# Patient Record
Sex: Male | Born: 1995 | Race: White | Hispanic: No | Marital: Single | Smoking: Never smoker
Health system: Southern US, Community
[De-identification: ages and names within clinical notes are randomized; demographics above are authoritative.]

## PROBLEM LIST (undated history)

## (undated) DIAGNOSIS — J189 Pneumonia, unspecified organism: Secondary | ICD-10-CM

## (undated) DIAGNOSIS — K3184 Gastroparesis: Secondary | ICD-10-CM

## (undated) DIAGNOSIS — J45909 Unspecified asthma, uncomplicated: Secondary | ICD-10-CM

## (undated) HISTORY — DX: Pneumonia, unspecified organism: J18.9

## (undated) HISTORY — DX: Gastroparesis: K31.84

---
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
## 2016-05-03 ENCOUNTER — Ambulatory Visit (INDEPENDENT_AMBULATORY_CARE_PROVIDER_SITE_OTHER)
Admit: 2016-05-03 | Discharge: 2016-05-03 | Disposition: A | Discharge status: Home / Self Care | Payer: BLUE CROSS/BLUE SHIELD | Attending: Family Medicine | Admitting: Family Medicine

## 2016-05-03 ENCOUNTER — Ambulatory Visit
Admission: EM | Admit: 2016-05-03 | Discharge: 2016-05-03 | Disposition: A | Discharge status: Home / Self Care | Payer: BLUE CROSS/BLUE SHIELD | Attending: Family Medicine | Admitting: Family Medicine

## 2016-05-03 DIAGNOSIS — G4489 Other headache syndrome: Secondary | ICD-10-CM | POA: Diagnosis not present

## 2016-05-03 DIAGNOSIS — S060X0A Concussion without loss of consciousness, initial encounter: Secondary | ICD-10-CM | POA: Diagnosis not present

## 2016-05-03 DIAGNOSIS — S0990XA Unspecified injury of head, initial encounter: Secondary | ICD-10-CM

## 2016-05-03 NOTE — ED Triage Notes (Signed)
Pt c/o headache that started yesterday after he hit his head on the under side of a car he was working on. He was sliding underneath it went he hit his head on the bottom of the car. Top of crown is sore, no visible bruising or lacerations.

## 2016-05-03 NOTE — ED Provider Notes (Signed)
[facility]    CSN: [id number] Arrival date & time: 05/03/16  1703     History   Chief Complaint Chief Complaint  Patient presents with  . Headache    HPI Corey Schroeder is a 21 y.o. male.   Patient is a 21 year old white male hit his head on a car engine lift yesterday. He was doing work on [employer] and apparently wasn't aware of closest and was engine lift and hit his head on the engine. He denies any loss of consciousness yesterday but states became lightheaded dizzy. The headache has gotten worse she's had increased lightheadedness and dizziness and has some nausea today as well. Because of the increased headache and dizziness and this lasting more than 24 hours he came in to be seen and evaluated. No previous surgeries or operations he has a history of asthma gastroparesis and history pneumonia before. He does not smoke and is a history of heart disease both sides of his family grandparents. No known drug allergies   The history is provided by the patient. No language interpreter was used.  Headache  Radiates to:  Upper back and lower back Timing:  Rare Progression:  Worsening Similar to prior headaches: yes   Associated symptoms: fatigue and nausea     Past Medical History:  Diagnosis Date  . Asthma   . Gastroparesis   . Pneumonia     Patient Active Problem List   Diagnosis Date Noted  . Nausea with vomiting 03/30/2015    History reviewed. No pertinent surgical history.     Home Medications    Prior to Admission medications   Medication Sig Start Date End Date Taking? Authorizing Provider  AMBULATORY NON FORMULARY MEDICATION Digestie Max 1 tablet by mouth per meal    Historical Provider, MD    Family History Family History  Problem Relation Age of Onset  . Heart disease Paternal Grandfather   . Heart disease Maternal Grandfather     Social History Social History  Substance Use Topics  . Smoking status: Never Smoker  . Smokeless  tobacco: Never Used  . Alcohol use No     Allergies   Patient has no known allergies.   Review of Systems Review of Systems  Constitutional: Positive for fatigue.  Gastrointestinal: Positive for nausea.  Neurological: Positive for headaches.     Physical Exam Triage Vital Signs ED Triage Vitals  Enc Vitals Group     BP 05/03/16 1711 124/67     Pulse Rate 05/03/16 1711 75     Resp 05/03/16 1711 18     Temp 05/03/16 1711 97.6 F (36.4 C)     Temp Source 05/03/16 1711 Oral     SpO2 05/03/16 1711 100 %     Weight 05/03/16 1711 130 lb (59 kg)     Height 05/03/16 1711 5\' 11"  (1.803 m)     Head Circumference --      Peak Flow --      Pain Score 05/03/16 1712 8     Pain Loc --      Pain Edu? --      Excl. in GC? --    No data found.   Updated Vital Signs BP 124/67 (BP Location: Left Arm)   Pulse 75   Temp 97.6 F (36.4 C) (Oral)   Resp 18   Ht 5\' 11"  (1.803 m)   Wt 130 lb (59 kg)   SpO2 100%   BMI 18.13 kg/m  Visual Acuity Right Eye Distance:   Left Eye Distance:   Bilateral Distance:    Right Eye Near:   Left Eye Near:    Bilateral Near:     Physical Exam  Constitutional: He is oriented to person, place, and time. He appears well-developed and well-nourished.  HENT:  Head: Normocephalic and atraumatic.  Right Ear: External ear normal.  Left Ear: External ear normal.  Nose: Nose normal. Right sinus exhibits no maxillary sinus tenderness and no frontal sinus tenderness. Left sinus exhibits no maxillary sinus tenderness and no frontal sinus tenderness.  Mouth/Throat: Uvula is midline, oropharynx is clear and moist and mucous membranes are normal.  Eyes: EOM are normal. Pupils are equal, round, and reactive to light.  Neck: Normal range of motion. Neck supple. No tracheal deviation present.  Cardiovascular: Regular rhythm and normal heart sounds.   Pulmonary/Chest: Effort normal.  Musculoskeletal: Normal range of motion.  Lymphadenopathy:    He has no  cervical adenopathy.  Neurological: He is alert and oriented to person, place, and time.  Skin: Skin is warm.  Psychiatric: He has a normal mood and affect. His behavior is normal.  Vitals reviewed.    UC Treatments / Results  Labs (all labs ordered are listed, but only abnormal results are displayed) Labs Reviewed - No data to display  EKG  EKG Interpretation None       Radiology Ct Head Wo Contrast  Result Date: 05/03/2016 CLINICAL DATA:  Pt c/o headache that started yesterday after he hit his head on the under side of a car he was working on. He was sliding underneath it went he hit his head on the bottom of the car. EXAM: CT HEAD WITHOUT CONTRAST TECHNIQUE: Contiguous axial images were obtained from the base of the skull through the vertex without intravenous contrast. COMPARISON:  None. FINDINGS: Brain: No evidence of acute infarction, hemorrhage, hydrocephalus, extra-axial collection or mass lesion/mass effect. Vascular: No hyperdense vessel or unexpected calcification. Skull: No osseous abnormality. Sinuses/Orbits: Visualized paranasal sinuses are clear. Visualized mastoid sinuses are clear. Visualized orbits demonstrate no focal abnormality. Other: None IMPRESSION: No acute intracranial pathology. Electronically Signed   By: Elige KoHetal  Patel   On: 05/03/2016 18:15    Procedures Procedures (including critical care time)  Medications Ordered in UC Medications - No data to display   Initial Impression / Assessment and Plan / UC Course  I have reviewed the triage vital signs and the nursing notes.  Pertinent labs & imaging results that were available during my care of the patient were reviewed by me and considered in my medical decision making (see chart for details).     Patient CT scan was negative patient form he has a closed head injury concussion. This time recommend TV rest  & electronic rest. We work note for today and tomorrow. Follow-up PCP if not better by next  week.  Final Clinical Impressions(s) / UC Diagnoses   Final diagnoses:  Other headache syndrome  Traumatic injury of head, initial encounter  Concussion without loss of consciousness, initial encounter       New Prescriptions New Prescriptions   No medications on file    Note: This dictation was prepared with Dragon dictation along with smaller phrase technology. Any transcriptional errors that result from this process are unintentional.   Hassan RowanEugene Kordell Jafri, MD 05/03/16 [phone]

## 2018-09-13 IMAGING — CT CT HEAD W/O CM
4 series · 17 of 47 positions shown, 19 images · non-contrast
Comparison: None.

CLINICAL DATA: Pt c/o headache that started yesterday after he hit
his head on the under side of a car he was working on. He was
sliding underneath it went he hit his head on the bottom of the car.

EXAM:
CT HEAD WITHOUT CONTRAST
TECHNIQUE: Contiguous axial images were obtained from the base of the skull
through the vertex without intravenous contrast.

[Series 2: head wo · axial · 0.40mm/px · z∈[+393,+498]mm · 6 of 31 slices shown, 8 images]
[im 5/31  brain]
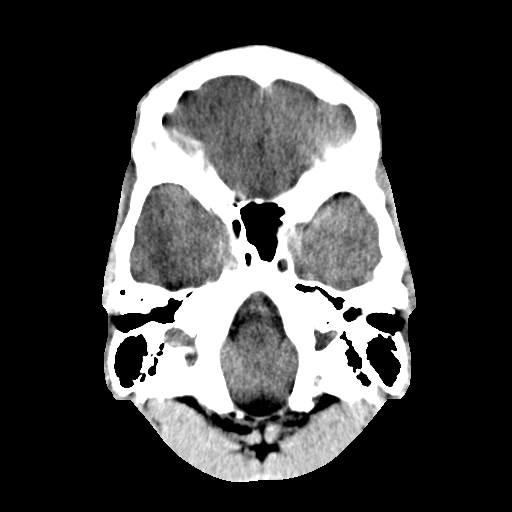
[im 5/31  bone]
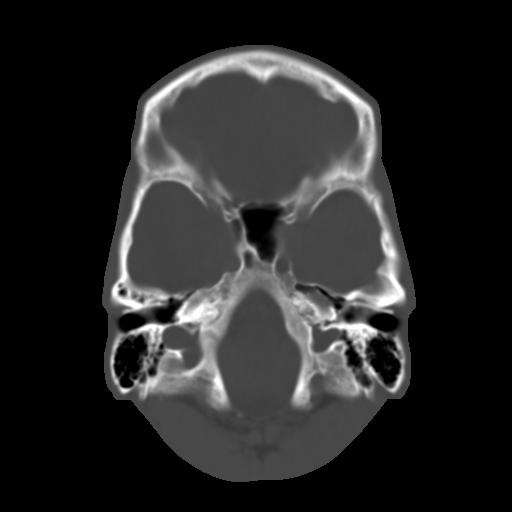
[im 9/31  brain]
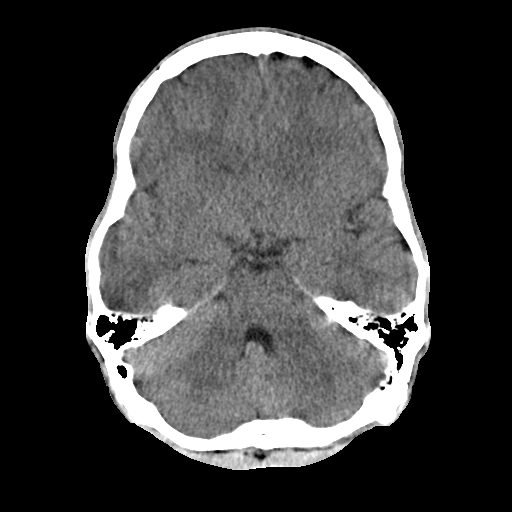
[im 13/31  brain]
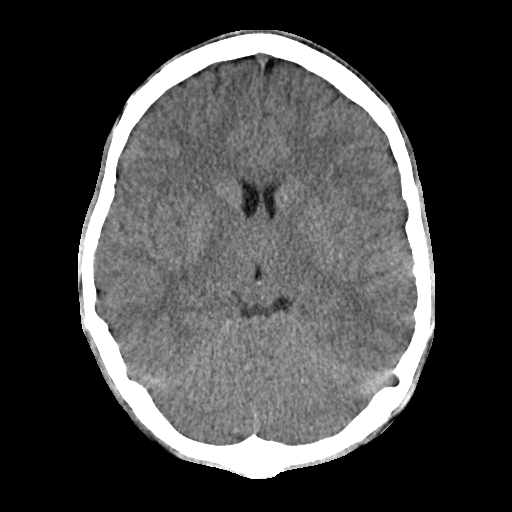
[im 18/31  brain]
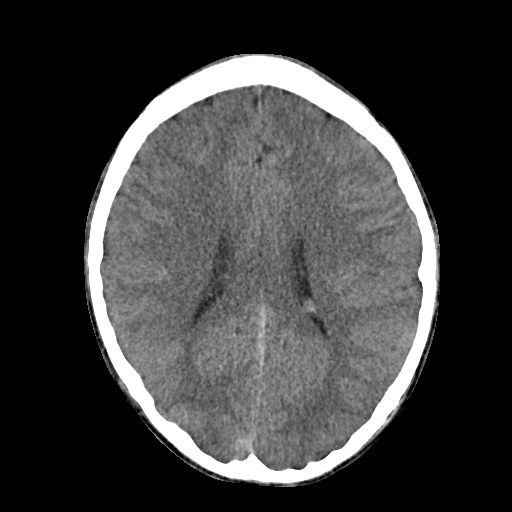
[im 22/31  brain]
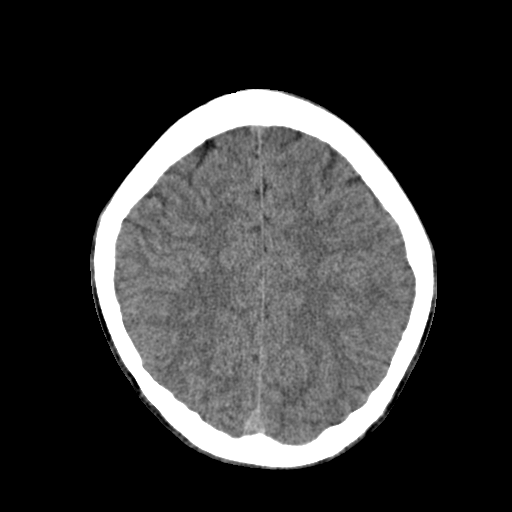
[im 22/31  bone]
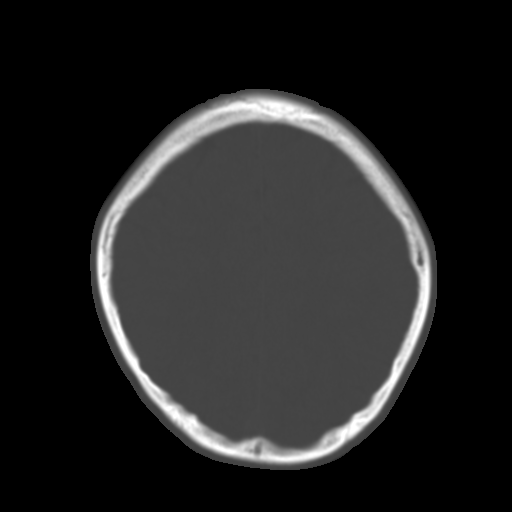
[im 26/31  brain]
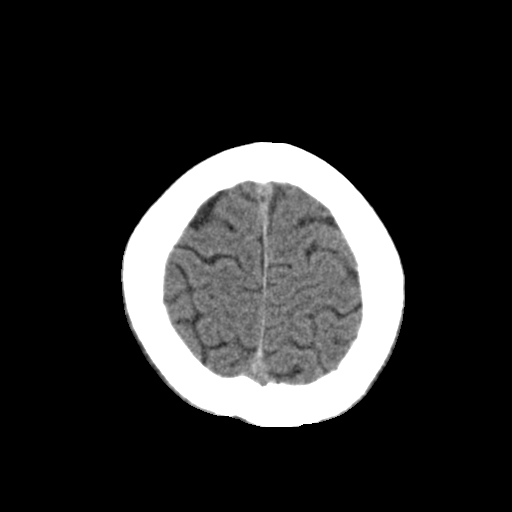

[Series 3: head bone · axial · 0.40mm/px · z∈[+385,+459]mm · 5 of 79 slices shown]
[im 8/79  bone]
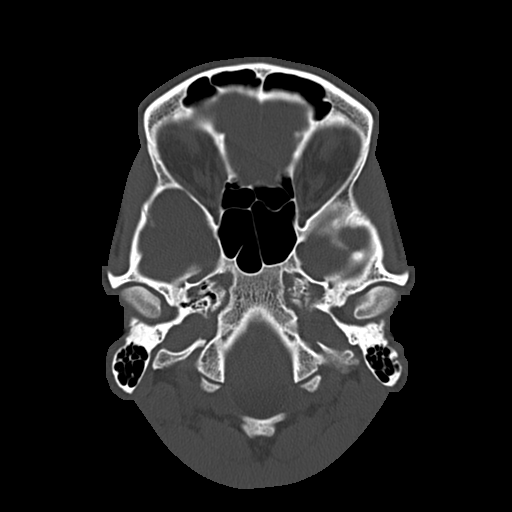
[im 15/79  bone]
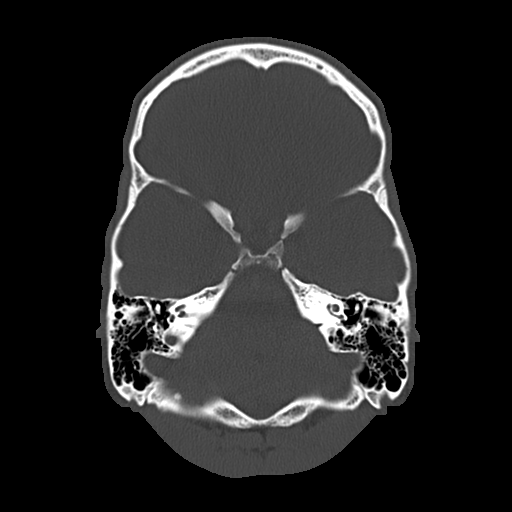
[im 27/79  bone]
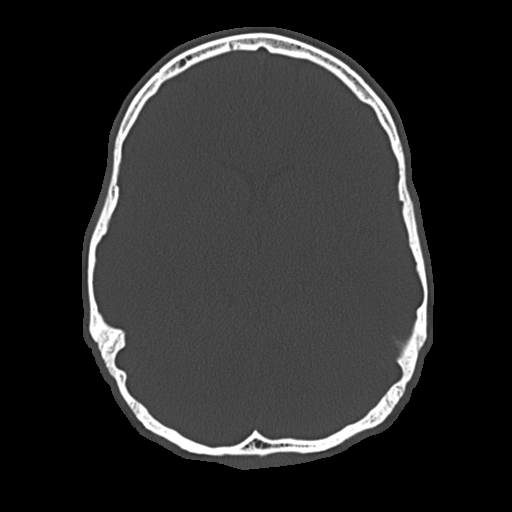
[im 34/79  bone]
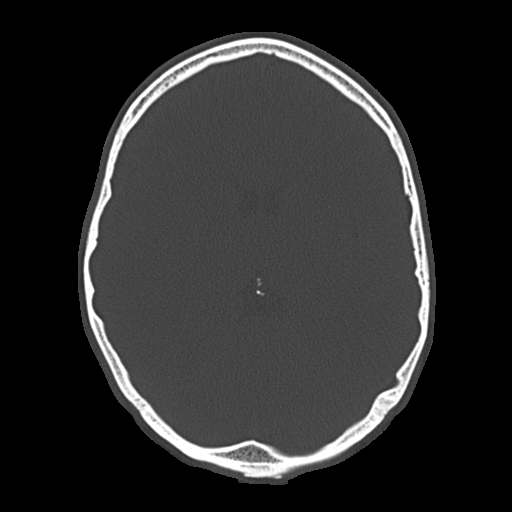
[im 45/79  bone]
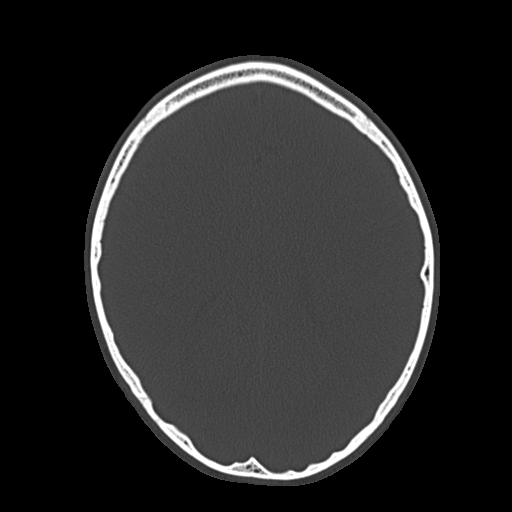

[Series 602: coronal · coronal · 0.40mm/px · 3 of 67 slices shown]
[im 23/67  brain]
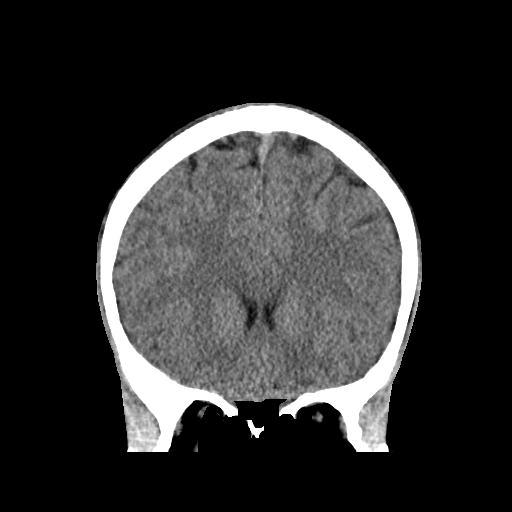
[im 30/67  brain]
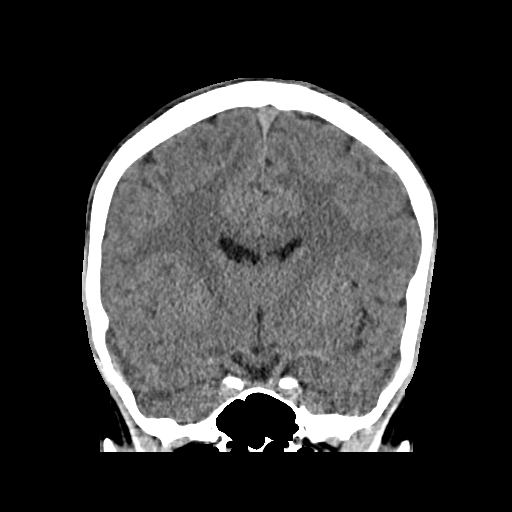
[im 37/67  brain]
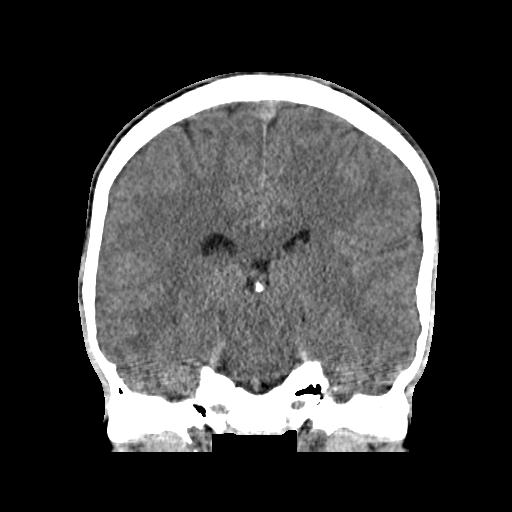

[Series 603: sagittal · sagittal · 0.40mm/px · 3 of 57 slices shown]
[im 19/57  brain]
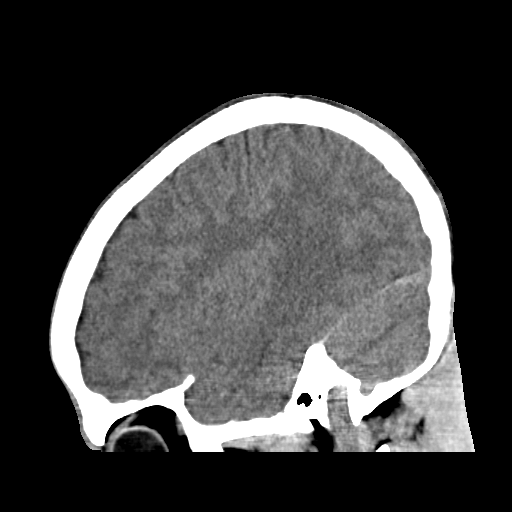
[im 29/57  brain]
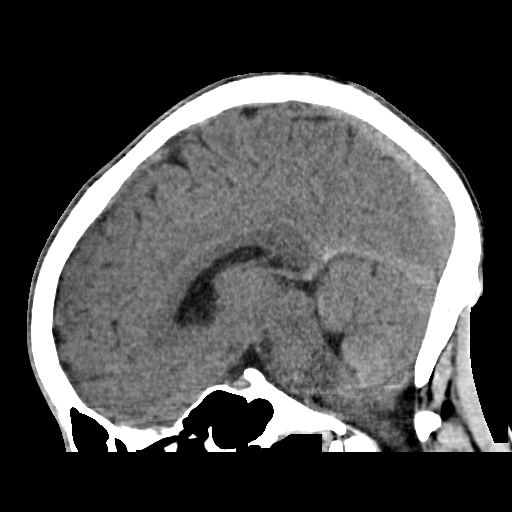
[im 38/57  brain]
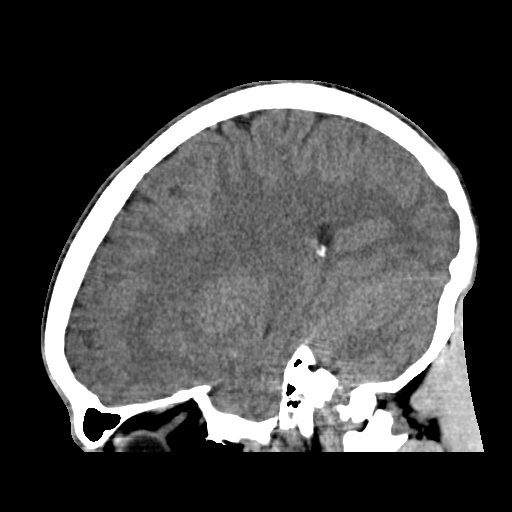

[17 of 47 positions shown; findings below may reference images not displayed]

FINDINGS: Brain: No evidence of acute infarction, hemorrhage, hydrocephalus,
extra-axial collection or mass lesion/mass effect.

Vascular: No hyperdense vessel or unexpected calcification.

Skull: No osseous abnormality.

Sinuses/Orbits: Visualized paranasal sinuses are clear. Visualized
mastoid sinuses are clear. Visualized orbits demonstrate no focal
abnormality.

Other: None
IMPRESSION: No acute intracranial pathology.
